# Patient Record
Sex: Male | Born: 2000 | Race: White | Hispanic: No | Marital: Single | Smoking: Never smoker
Health system: Southern US, Community
[De-identification: ages and names within clinical notes are randomized; demographics above are authoritative.]

## PROBLEM LIST (undated history)

## (undated) HISTORY — PX: CIRCUMCISION: SUR203

---
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
## 2020-08-28 ENCOUNTER — Ambulatory Visit: Payer: Self-pay

## 2020-09-03 ENCOUNTER — Ambulatory Visit: Payer: Medicaid Other | Attending: Internal Medicine

## 2020-09-03 DIAGNOSIS — Z23 Encounter for immunization: Secondary | ICD-10-CM

## 2020-09-03 NOTE — Progress Notes (Signed)
   Covid-19 Vaccination Clinic  Name:  Shawne Eskelson    MRN: [id number] DOB: 07-18-2001  09/03/2020  Mr. Mundie was observed post Covid-19 immunization for 15 minutes without incident. He was provided with Vaccine Information Sheet and instruction to access the V-Safe system.   Mr. Mundie was instructed to call 911 with any severe reactions post vaccine: Marland Kitchen Difficulty breathing  . Swelling of face and throat  . A fast heartbeat  . A bad rash all over body  . Dizziness and weakness   Immunizations Administered    Name Date Dose VIS Date Route   Pfizer COVID-19 Vaccine 09/03/2020  2:08 PM 0.3 mL 07/18/2020 Intramuscular   Manufacturer: [employer], [employer]   Lot: [id number]   NDC: [address]-6

## 2020-09-24 ENCOUNTER — Ambulatory Visit: Payer: Medicaid Other | Attending: Internal Medicine

## 2020-09-24 DIAGNOSIS — Z23 Encounter for immunization: Secondary | ICD-10-CM

## 2020-09-24 NOTE — Progress Notes (Signed)
   Covid-19 Vaccination Clinic  Name:  Sean Kramer    MRN: [id number] DOB: October 11, 2000  09/24/2020  [name] was observed post Covid-19 immunization for 15 minutes without incident. He was provided with Vaccine Information Sheet and instruction to access the V-Safe system.   [name] was instructed to call 911 with any severe reactions post vaccine: Marland Kitchen Difficulty breathing  . Swelling of face and throat  . A fast heartbeat  . A bad rash all over body  . Dizziness and weakness   Immunizations Administered    Name Date Dose VIS Date Route   Pfizer COVID-19 Vaccine 09/24/2020  2:37 PM 0.3 mL 07/18/2020 Intramuscular   Manufacturer: [employer], [employer]   Lot: [id number]   NDC: [address]-4

## 2021-02-20 ENCOUNTER — Ambulatory Visit: Payer: Medicaid Other | Attending: Internal Medicine

## 2021-02-20 ENCOUNTER — Other Ambulatory Visit: Payer: Self-pay

## 2021-02-20 ENCOUNTER — Other Ambulatory Visit (HOSPITAL_BASED_OUTPATIENT_CLINIC_OR_DEPARTMENT_OTHER): Payer: Self-pay

## 2021-02-20 DIAGNOSIS — Z23 Encounter for immunization: Secondary | ICD-10-CM

## 2021-02-20 MED ORDER — PFIZER-BIONT COVID-19 VAC-TRIS 30 MCG/0.3ML IM SUSP
INTRAMUSCULAR | 0 refills | Status: AC
  Filled 2021-02-20: qty 0.3, 1d supply, fill #0

## 2021-02-20 NOTE — Progress Notes (Signed)
   Covid-19 Vaccination Clinic  Name:  Sean Kramer    MRN: [id number] DOB: [birth year]  02/20/2021  Mr. Astle was observed post Covid-19 immunization for 15 minutes without incident. He was provided with Vaccine Information Sheet and instruction to access the V-Safe system.   Mr. Hard was instructed to call 911 with any severe reactions post vaccine: Marland Kitchen Difficulty breathing  . Swelling of face and throat  . A fast heartbeat  . A bad rash all over body  . Dizziness and weakness   Immunizations Administered    Name Date Dose VIS Date Route   PFIZER Comrnaty(Gray TOP) Covid-19 Vaccine 02/20/2021 11:56 AM 0.3 mL 09/06/2020 Intramuscular   Manufacturer: [employer], [employer]   Lot: [id number]   NDC: [phone]
